# Patient Record
Sex: Male | Born: 1985 | Race: White | Hispanic: No | Marital: Single | Smoking: Former smoker
Health system: Southern US, Community
[De-identification: ages and names within clinical notes are randomized; demographics above are authoritative.]

---

## 2016-11-09 ENCOUNTER — Ambulatory Visit (INDEPENDENT_AMBULATORY_CARE_PROVIDER_SITE_OTHER): Payer: Managed Care, Other (non HMO) | Admitting: Family Medicine

## 2016-11-09 DIAGNOSIS — J209 Acute bronchitis, unspecified: Secondary | ICD-10-CM

## 2016-11-11 NOTE — Progress Notes (Signed)
Subjective:    Patient ID: Hunter Lee, male    DOB: 1985/04/22, 31 y.o.   MRN: [id number] No chief complaint on file.   HPI   No past medical history on file. No past surgical history on file. No current outpatient prescriptions on file prior to visit.   No current facility-administered medications on file prior to visit.    Allergies not on file No family history on file. Social History   Social History  . Marital status: Single    Spouse name: N/A  . Number of children: N/A  . Years of education: N/A   Social History Main Topics  . Smoking status: Not on file  . Smokeless tobacco: Not on file  . Alcohol use Not on file  . Drug use: Unknown  . Sexual activity: Not on file   Other Topics Concern  . Not on file   Social History Narrative  . No narrative on file      Review of Systems  Constitutional: Positive for activity change, appetite change, chills, diaphoresis and fatigue. Negative for fever.  HENT: Positive for congestion, ear pain, sore throat and voice change. Negative for ear discharge, postnasal drip, rhinorrhea, sinus pressure, sneezing and trouble swallowing.   Respiratory: Positive for cough. Negative for shortness of breath and wheezing.   Cardiovascular: Negative for chest pain.  Gastrointestinal: Negative for abdominal pain, constipation, diarrhea, nausea and vomiting.  Genitourinary: Negative for difficulty urinating and dysuria.  Musculoskeletal: Positive for arthralgias. Negative for myalgias, neck pain and neck stiffness.  Allergic/Immunologic: Negative for immunocompromised state.  Hematological: Positive for adenopathy.  Psychiatric/Behavioral: Positive for sleep disturbance.   See hpi    Objective:   Physical Exam  Constitutional: He is oriented to person, place, and time. He appears well-developed and well-nourished. He appears lethargic. He appears ill. No distress.  HENT:  Head: Normocephalic and atraumatic.  Right Ear:  External ear and ear canal normal. Tympanic membrane is erythematous.  Left Ear: External ear and ear canal normal. A middle ear effusion is present.  Nose: Nose normal.  Mouth/Throat: Mucous membranes are normal. Posterior oropharyngeal erythema present. No oropharyngeal exudate.  Eyes: Conjunctivae are normal. No scleral icterus.  Neck: Normal range of motion. Neck supple. No thyromegaly present.  Cardiovascular: Normal rate, regular rhythm, normal heart sounds and intact distal pulses.   Pulmonary/Chest: Effort normal and breath sounds normal. No respiratory distress.  Coarsened breath sounds throughout  Abdominal: Soft. Bowel sounds are normal. He exhibits no distension and no mass. There is no tenderness. There is no rebound and no guarding.  Musculoskeletal: He exhibits no edema.  Lymphadenopathy:       Head (right side): Submandibular adenopathy present.       Head (left side): Tonsillar adenopathy present.    He has cervical adenopathy.       Right cervical: Superficial cervical adenopathy present.       Left cervical: Superficial cervical adenopathy present.       Right: No supraclavicular adenopathy present.       Left: No supraclavicular adenopathy present.  Neurological: He is oriented to person, place, and time. He appears lethargic.  Skin: Skin is warm and dry. He is not diaphoretic. No erythema.  Psychiatric: He has a normal mood and affect. His behavior is normal.      There were no vitals taken for this visit.     Assessment & Plan:   1. Acute bronchitis, unspecified organism      Carley Hammed  Clelia Croft, M.D.  Primary Care at [facility] [address] [place], [place] [address] [phone] phone [phone] fax  11/11/16 5:35 AM

## 2017-01-10 ENCOUNTER — Ambulatory Visit: Payer: Managed Care, Other (non HMO) | Admitting: Physician Assistant

## 2017-01-10 VITALS — BP 142/70 | HR 77 | Temp 100.0°F | Ht 79.0 in | Wt 291.0 lb

## 2017-01-10 DIAGNOSIS — J029 Acute pharyngitis, unspecified: Secondary | ICD-10-CM | POA: Diagnosis not present

## 2017-01-10 LAB — POCT RAPID STREP A (OFFICE): Rapid Strep A Screen: NEGATIVE

## 2017-01-10 MED ORDER — AMOXICILLIN 500 MG PO CAPS: 500.0000 mg | ORAL_CAPSULE | Freq: Two times a day (BID) | ORAL | 0 refills | Status: DC

## 2017-01-10 NOTE — Patient Instructions (Addendum)
I will contact you regarding the result of your strep culture via mychart Make sure you are taking ibuprofen or tylenol for pain and fever.   Pharyngitis Pharyngitis is a sore throat (pharynx). There is redness, pain, and swelling of your throat. Follow these instructions at home:  Drink enough fluids to keep your pee (urine) clear or pale yellow.  Only take medicine as told by your doctor. ? You may get sick again if you do not take medicine as told. Finish your medicines, even if you start to feel better. ? Do not take aspirin.  Rest.  Rinse your mouth (gargle) with salt water ( tsp of salt per 1 qt of water) every 1-2 hours. This will help the pain.  If you are not at risk for choking, you can suck on hard candy or sore throat lozenges. Contact a doctor if:  You have large, tender lumps on your neck.  You have a rash.  You cough up green, yellow-brown, or bloody spit. Get help right away if:  You have a stiff neck.  You drool or cannot swallow liquids.  You throw up (vomit) or are not able to keep medicine or liquids down.  You have very bad pain that does not go away with medicine.  You have problems breathing (not from a stuffy nose). This information is not intended to replace advice given to you by your health care provider. Make sure you discuss any questions you have with your health care provider. Document Released: 07/05/2007 Document Revised: 06/24/2015 Document Reviewed: 09/23/2012 Elsevier Interactive Patient Education  2017 [employer] Inc[employer]  Fever, Adult A fever is an increase in the body's temperature. It is often defined as a temperature of 100 F (38C) or higher. Short mild or moderate fevers often have no long-term effects. They also often do not need treatment. Moderate or high fevers may make you feel uncomfortable. Sometimes, they can also be a sign of a serious illness or disease. The sweating that may happen with repeated fevers or fevers that last a  while may also cause you to not have enough fluid in your body (dehydration). You can take your temperature with a thermometer to see if you have a fever. A measured temperature can change with:  Age.  Time of day.  Where the thermometer is placed: ? Mouth (oral). ? Rectum (rectal). ? Ear (tympanic). ? Underarm (axillary). ? Forehead (temporal).  Follow these instructions at home: Pay attention to any changes in your symptoms. Take these actions to help with your condition:  Take over-the-counter and prescription medicines only as told by your doctor. Follow the dosing instructions carefully.  If you were prescribed an antibiotic medicine, take it as told by your doctor. Do not stop taking the antibiotic even if you start to feel better.  Rest as needed.  Drink enough fluid to keep your pee (urine) clear or pale yellow.  Sponge yourself or bathe with room-temperature water as needed. This helps to lower your body temperature . Do not use ice water.  Do not wear too many blankets or heavy clothes.  Contact a doctor if:  You throw up (vomit).  You cannot eat or drink without throwing up.  You have watery poop (diarrhea).  It hurts when you pee.  Your symptoms do not get better with treatment.  You have new symptoms.  You feel very weak. Get help right away if:  You are short of breath or have trouble breathing.  You are dizzy  or you pass out (faint).  You feel confused.  You have signs of not having enough fluid in your body, such as: ? A dry mouth. ? Peeing less. ? Looking pale.  You have very bad pain in your belly (abdomen).  You keep throwing up or having water poop.  You have a skin rash.  Your symptoms suddenly get worse. This information is not intended to replace advice given to you by your health care provider. Make sure you discuss any questions you have with your health care provider. Document Released: 10/26/2007 Document Revised: 06/24/2015  Document Reviewed: 03/12/2014 Elsevier Interactive Patient Education  2018 Elsevier Inc. Sore Throat When you have a sore throat, your throat may:  Hurt.  Burn.  Feel irritated.  Feel scratchy.  Many things can cause a sore throat, including:  An infection.  Allergies.  Dryness in the air.  Smoke or pollution.  Gastroesophageal reflux disease (GERD).  A tumor.  A sore throat can be the first sign of another sickness. It can happen with other problems, like coughing or a fever. Most sore throats go away without treatment. Follow these instructions at home:  Take over-the-counter medicines only as told by your doctor.  Drink enough fluids to keep your pee (urine) clear or pale yellow.  Rest when you feel you need to.  To help with pain, try: ? Sipping warm liquids, such as broth, herbal tea, or warm water. ? Eating or drinking cold or frozen liquids, such as frozen ice pops. ? Gargling with a salt-water mixture 3-4 times a day or as needed. To make a salt-water mixture, add -1 tsp of salt in 1 cup of warm water. Mix it until you cannot see the salt anymore. ? Sucking on hard candy or throat lozenges. ? Putting a cool-mist humidifier in your bedroom at night. ? Sitting in the bathroom with the door closed for 5-10 minutes while you run hot water in the shower.  Do not use any tobacco products, such as cigarettes, chewing tobacco, and e-cigarettes. If you need help quitting, ask your doctor. Contact a doctor if:  You have a fever for more than 2-3 days.  You keep having symptoms for more than 2-3 days.  Your throat does not get better in 7 days.  You have a fever and your symptoms suddenly get worse. Get help right away if:  You have trouble breathing.  You cannot swallow fluids, soft foods, or your saliva.  You have swelling in your throat or neck that gets worse.  You keep feeling like you are going to throw up (vomit).  You keep throwing up. This  information is not intended to replace advice given to you by your health care provider. Make sure you discuss any questions you have with your health care provider. Document Released: 10/26/2007 Document Revised: 09/12/2015 Document Reviewed: 11/06/2014 Elsevier Interactive Patient Education  2018 [employer]     IF you received an x-ray today, you will receive an invoice from [facility]. Please contact [facility] at [phone] with questions or concerns regarding your invoice.   IF you received labwork today, you will receive an invoice from [facility]. Please contact [facility] at [phone] with questions or concerns regarding your invoice.   Our billing staff will not be able to assist you with questions regarding bills from these companies.  You will be contacted with the lab results as soon as they are available. The fastest way to get your results is to activate  your My Chart account. Instructions are located on the last page of this paperwork. If you have not heard from [place] regarding the results in 2 weeks, please contact this office.

## 2017-01-10 NOTE — Progress Notes (Signed)
[facility] [address], [place] [phone]  Date:  01/10/2017   Name:  Hunter Lee   DOB:  1985-12-24   MRN:  [id number]  PCP:  Patient, No Pcp Per    History of Present Illness:  Hunter Lee is a 31 y.o. male patient who presents to PCP with  Chief Complaint  Patient presents with  . Sore Throat    flu-like symptoms  . Fever     Throat pain, and white pus at the throat.  He has had feverish, body aches.  No nasal congestiton or cough.  He has not done much, but magic mouthwash.     There are no active problems to display for this patient.   No past medical history on file.    Social History   Tobacco Use  . Smoking status: Not on file  Substance Use Topics  . Alcohol use: Not on file  . Drug use: Not on file    No family history on file.  Allergies not on file  Medication list has been reviewed and updated.  No current outpatient medications on file prior to visit.   No current facility-administered medications on file prior to visit.     ROS ROS otherwise unremarkable unless listed above.  Physical Examination: BP (!) 142/70 (BP Location: Left Arm, Patient Position: Sitting, Cuff Size: Large)   Pulse 77   Temp 100 F (37.8 C) (Oral)   Ht 6\' 7"  (2.007 m)   Wt 291 lb (132 kg)   SpO2 99%   BMI 32.78 kg/m  Ideal Body Weight: Weight in (lb) to have BMI = 25: 221.5  Physical Exam  Constitutional: He is oriented to person, place, and time. He appears well-developed and well-nourished. No distress.  HENT:  Head: Normocephalic and atraumatic.  Right Ear: Tympanic membrane, external ear and ear canal normal.  Left Ear: Tympanic membrane, external ear and ear canal normal.  Nose: No mucosal edema or rhinorrhea. Right sinus exhibits no maxillary sinus tenderness and no frontal sinus tenderness. Left sinus exhibits no maxillary sinus tenderness and no frontal sinus tenderness.  Mouth/Throat: No uvula swelling.  Posterior oropharyngeal edema and posterior oropharyngeal erythema present. Tonsils are 2+ on the right. Tonsils are 2+ on the left. Tonsillar exudate.  Eyes: Conjunctivae, EOM and lids are normal. Pupils are equal, round, and reactive to light. Right eye exhibits normal extraocular motion. Left eye exhibits normal extraocular motion.  Neck: Trachea normal and full passive range of motion without pain. No edema and no erythema present.  Cardiovascular: Normal rate.  Pulmonary/Chest: Effort normal. No respiratory distress. He has no decreased breath sounds. He has no wheezes. He has no rhonchi.  Neurological: He is alert and oriented to person, place, and time.  Skin: Skin is warm and dry. He is not diaphoretic.  Psychiatric: He has a normal mood and affect. His behavior is normal.     Assessment and Plan: Hunter Lee is a 31 y.o. male who is here today for cc of  Chief Complaint  Patient presents with  . Sore Throat    flu-like symptoms  . Fever  will culture, and if this is negative, will advise to discontinue the antibiotic.  Acute pharyngitis, unspecified etiology - Plan: amoxicillin (AMOXIL) 500 MG capsule, DISCONTINUED: amoxicillin (AMOXIL) 500 MG capsule  Sore throat - Plan: POCT rapid strep A, Culture, Group A Strep  Trena PlattStephanie Ciji [place], PA-C [facility] 12/15/20183:34  PM

## 2017-01-12 LAB — CULTURE, GROUP A STREP

## 2017-05-09 ENCOUNTER — Encounter: Payer: Self-pay | Admitting: Physician Assistant

## 2017-05-15 ENCOUNTER — Ambulatory Visit: Payer: Managed Care, Other (non HMO) | Admitting: Physician Assistant

## 2017-05-15 ENCOUNTER — Telehealth: Payer: Self-pay | Admitting: Physician Assistant

## 2017-05-15 ENCOUNTER — Other Ambulatory Visit: Payer: Self-pay

## 2017-05-15 ENCOUNTER — Encounter: Payer: Self-pay | Admitting: Physician Assistant

## 2017-05-15 VITALS — BP 112/77 | HR 94 | Temp 98.2°F | Resp 16 | Ht 78.25 in | Wt 297.2 lb

## 2017-05-15 DIAGNOSIS — M545 Low back pain, unspecified: Secondary | ICD-10-CM

## 2017-05-15 MED ORDER — CYCLOBENZAPRINE HCL ER 30 MG PO CP24: 30.0000 mg | ORAL_CAPSULE | Freq: Every day | ORAL | 0 refills | Status: DC | PRN

## 2017-05-15 MED ORDER — TRAMADOL HCL 50 MG PO TABS: 50.0000 mg | ORAL_TABLET | Freq: Three times a day (TID) | ORAL | 0 refills | Status: DC | PRN

## 2017-05-15 MED ORDER — PREDNISONE 20 MG PO TABS: ORAL_TABLET | ORAL | 0 refills | Status: DC

## 2017-05-15 MED ORDER — NAPROXEN 500 MG PO TABS: 500.0000 mg | ORAL_TABLET | Freq: Two times a day (BID) | ORAL | 1 refills | Status: DC

## 2017-05-15 NOTE — Patient Instructions (Addendum)
You will receive a phone call to schedule an appointment with physical therapy.  Start taking Prednisone as directed.  Buy a foam roller use this 2x/day. Continue using heat as needed for back pain. Stay well hydrated (try to drink 2-3 liters of water daily.) Read below for trigger point injection info.  Continue stretching daily.   Naproxen is an NSAID. Do not use with any other otc pain medication other than tylenol/acetaminophen - so no aleve, ibuprofen, motrin, advil, etc. Take this daily for the next 2-3 weeks.   Amrix is a muscle relaxer. You may take this with Naproxen and Prednisone - do not take at the same time as tramadol.  Take this daily for the next week, then take it as needed.  If your pain is intolerable, you can take Tramadol at night for pain to help you sleep. Do not take this at the same time as Cyclobenzaprine (Amrix). Do not drink excessively while taking Tramadol.   Come back and see me in 3-4 weeks if you are not improving. We will do imaging at that time.   Tramadol tablets What is this medicine? TRAMADOL (TRA ma dole) is a pain reliever. It is used to treat moderate to severe pain in adults. This medicine may be used for other purposes; ask your health care provider or pharmacist if you have questions. COMMON BRAND NAME(S): Ultram How should I use this medicine? Take this medicine by mouth with a full glass of water. Follow the directions on the prescription label. You can take it with or without food. If it upsets your stomach, take it with food. Do not take your medicine more often than directed. A special MedGuide will be given to you by the pharmacist with each prescription and refill. Be sure to read this information carefully each time. Talk to your pediatrician regarding the use of this medicine in children. Special care may be needed. Overdosage: If you think you have taken too much of this medicine contact a poison control center or emergency room at  once. NOTE: This medicine is only for you. Do not share this medicine with others. What if I miss a dose? If you miss a dose, take it as soon as you can. If it is almost time for your next dose, take only that dose. Do not take double or extra doses. What may interact with this medicine? Do not take this medication with any of the following medicines: -MAOIs like Carbex, Eldepryl, Marplan, Nardil, and Parnate This medicine may also interact with the following medications: -alcohol -antihistamines for allergy, cough and cold -certain medicines for anxiety or sleep -certain medicines for depression like amitriptyline, fluoxetine, sertraline -certain medicines for migraine headache like almotriptan, eletriptan, frovatriptan, naratriptan, rizatriptan, sumatriptan, zolmitriptan -certain medicines for seizures like carbamazepine, oxcarbazepine, phenobarbital, primidone -general anesthetics like halothane, isoflurane, methoxyflurane, propofol -linezolid -local anesthetics like lidocaine, pramoxine, tetracaine -medicines that relax muscles for surgery -other narcotic medicines for pain or cough -phenothiazines like chlorpromazine, mesoridazine, prochlorperazine, thioridazine -procarbazine This list may not describe all possible interactions. Give your health care provider a list of all the medicines, herbs, non-prescription drugs, or dietary supplements you use. Also tell them if you smoke, drink alcohol, or use illegal drugs. Some items may interact with your medicine. What should I watch for while using this medicine? Tell your doctor or health care professional if your pain does not go away, if it gets worse, or if you have new or a different type of pain. You  may develop tolerance to the medicine. Tolerance means that you will need a higher dose of the medicine for pain relief. Tolerance is normal and is expected if you take this medicine for a long time. Do not suddenly stop taking your medicine  because you may develop a severe reaction. Your body becomes used to the medicine. This does NOT mean you are addicted. Addiction is a behavior related to getting and using a drug for a non-medical reason. If you have pain, you have a medical reason to take pain medicine. Your doctor will tell you how much medicine to take. If your doctor wants you to stop the medicine, the dose will be slowly lowered over time to avoid any side effects. There are different types of narcotic medicines (opiates). If you take more than one type at the same time or if you are taking another medicine that also causes drowsiness, you may have more side effects. Give your health care provider a list of all medicines you use. Your doctor will tell you how much medicine to take. Do not take more medicine than directed. Call emergency for help if you have problems breathing or unusual sleepiness. You may get drowsy or dizzy. Do not drive, use machinery, or do anything that needs mental alertness until you know how this medicine affects you. Do not stand or sit up quickly, especially if you are an older patient. This reduces the risk of dizzy or fainting spells. Alcohol can increase or decrease the effects of this medicine. Avoid alcoholic drinks. You may have constipation. Try to have a bowel movement at least every 2 to 3 days. If you do not have a bowel movement for 3 days, call your doctor or health care professional. Your mouth may get dry. Chewing sugarless gum or sucking hard candy, and drinking plenty of water may help. Contact your doctor if the problem does not go away or is severe. What side effects may I notice from receiving this medicine? Side effects that you should report to your doctor or health care professional as soon as possible: -allergic reactions like skin rash, itching or hives, swelling of the face, lips, or tongue -breathing problems -confusion -seizures -signs and symptoms of low blood pressure like  dizziness; feeling faint or lightheaded, falls; unusually weak or tired -trouble passing urine or change in the amount of urine Side effects that usually do not require medical attention (report to your doctor or health care professional if they continue or are bothersome): -constipation -dry mouth -nausea, vomiting -tiredness This list may not describe all possible side effects. Call your doctor for medical advice about side effects. You may report side effects to FDA at 1-800-FDA-1088. Where should I keep my medicine? Keep out of the reach of children. This medicine may cause accidental overdose and death if it taken by other adults, children, or pets. Mix any unused medicine with a substance like cat litter or coffee grounds. Then throw the medicine away in a sealed container like a sealed bag or a coffee can with a lid. Do not use the medicine after the expiration date. Store at room temperature between 15 and 30 degrees C (59 and 86 degrees F). NOTE: This sheet is a summary. It may not cover all possible information. If you have questions about this medicine, talk to your doctor, pharmacist, or health care provider.  2018 Elsevier/Gold Standard (2014-10-11 09:00:04)  Trigger Point Injection Trigger points are areas where you have pain. A trigger point injection  is a shot given in the trigger point to help relieve pain for a few days to a few months. Common places for trigger points include:  The neck.  The shoulders.  The upper back.  The lower back.  A trigger point injection will not cure long-lasting (chronic) pain permanently. These injections do not always work for every person, but for some people they can help to relieve pain for a few days to a few months. Tell a health care provider about:  Any allergies you have.  All medicines you are taking, including vitamins, herbs, eye drops, creams, and over-the-counter medicines.  Any problems you or family members have had  with anesthetic medicines.  Any blood disorders you have.  Any surgeries you have had.  Any medical conditions you have. What are the risks? Generally, this is a safe procedure. However, problems may occur, including:  Infection.  Bleeding.  Allergic reaction to the injected medicine.  Irritation of the skin around the injection site.  What happens before the procedure?  Ask your health care provider about changing or stopping your regular medicines. This is especially important if you are taking diabetes medicines or blood thinners. What happens during the procedure?  Your health care provider will feel for trigger points. A marker may be used to circle the area for the injection.  The skin over the trigger point will be washed with a germ-killing (antiseptic) solution.  A thin needle is used for the shot. You may feel pain or a twitching feeling when the needle enters the trigger point.  A numbing solution may be injected into the trigger point. Sometimes a medicine to keep down swelling, redness, and warmth (inflammation) is also injected.  Your health care provider may move the needle around the area where the trigger point is located until the tightness and twitching goes away.  After the injection, your health care provider may put gentle pressure over the injection site.  The injection site will be covered with a bandage (dressing). The procedure may vary among health care providers and hospitals. What happens after the procedure?  The dressing can be taken off in a few hours or as told by your health care provider.  You may feel sore and stiff for 1-2 days. This information is not intended to replace advice given to you by your health care provider. Make sure you discuss any questions you have with your health care provider. Document Released: 01/05/2011 Document Revised: 09/19/2015 Document Reviewed: 07/06/2014 Elsevier Interactive Patient Education  [employer].  Thank you for coming in today. I hope you feel we met your needs.  Feel free to call PCP if you have any questions or further requests.  Please consider signing up for MyChart if you do not already have it, as this is a great way to communicate with me.  Best,  Whitney McVey, PA-C  IF you received an x-ray today, you will receive an invoice from [facility]. Please contact [facility] at [phone] with questions or concerns regarding your invoice.   IF you received labwork today, you will receive an invoice from [facility]. Please contact [facility] at [phone] with questions or concerns regarding your invoice.   Our billing staff will not be able to assist you with questions regarding bills from these companies.  You will be contacted with the lab results as soon as they are available. The fastest way to get your results is to activate your My Chart account. Instructions are  located on the last page of this paperwork. If you have not heard from [place] regarding the results in 2 weeks, please contact this office.

## 2017-05-15 NOTE — Telephone Encounter (Signed)
Copied from CRM [phone]. Topic: Quick Communication - See Telephone Encounter >> May 15, 2017  5:10 PM Lorrine KinMcGee, Blake Vetrano B, [place] wrote: CRM for notification. See Telephone encounter for: 05/15/17. Caleb at [facility] calling to see if an alternative for cyclobenzaprine (AMRIX) 30 MG 24 hr capsule can be prescribed because the patient is going to have to pay $528 out of pocket for this medication. Wilber OliphantCaleb is requesting a call back [phone]. Patient [place] tomorrow.

## 2017-05-15 NOTE — Progress Notes (Signed)
   Hunter BouillonStephen Lee  MRN: [id number] DOB: Sep 04, 1985  PCP: Patient, No Pcp Per  Subjective:  Pt is a 32 year old male who presents to clinic for back pain x 3 weeks. Pain is of the left side of his lower back. Radiates up his back and occasionally down right leg. When it's bad, he cannot stand up straight. Pain is occasionally "sharp".  No MOI.  Massaging with a baseball against a wall makes it better. He is stretching, which is helping.  Baclofen does not help. Ibuprofen and excedrin are not helping.   He has had off and on back problems x 15 years.   He has been to chiropractor for this problem - last OV was >1 year ago.   Has never been to PT for this. Has never had dry needling.   ROS below.   Review of Systems  Gastrointestinal: Negative for constipation and diarrhea.  Genitourinary: Negative for decreased urine volume, dysuria, enuresis, frequency, hematuria and urgency.  Musculoskeletal: Positive for back pain and gait problem.  Neurological: Negative for weakness and numbness.    There are no active problems to display for this patient.   No current outpatient medications on file prior to visit.   No current facility-administered medications on file prior to visit.     No Known Allergies   Objective:  BP 112/77 (BP Location: Right Arm)   Pulse 94   Temp 98.2 F (36.8 C) (Oral)   Resp 16   Ht 6' 6.25" (1.988 m)   Wt 297 lb 3.2 oz (134.8 kg)   SpO2 98%   BMI 34.13 kg/m   Physical Exam  Constitutional: He is oriented to person, place, and time. He appears well-developed and well-nourished.  Pulmonary/Chest: Effort normal. No respiratory distress.  Musculoskeletal:       Right hip: He exhibits normal range of motion, normal strength, no tenderness and no bony tenderness.       Lumbar back: He exhibits decreased range of motion and tenderness. He exhibits no bony tenderness and no deformity.       Back:  Neurological: He is alert and oriented to person, place,  and time. Gait normal.  Skin: Skin is warm and dry.  Psychiatric: He has a normal mood and affect. His behavior is normal. Judgment and thought content normal.  Vitals reviewed.   Assessment and Plan :  1. Left-sided low back pain without sciatica, unspecified chronicity - predniSONE (DELTASONE) 20 MG tablet; Take 3 PO QAM x3days, 2 PO QAM x3days, 1 PO QAM x3days  Dispense: 18 tablet; Refill: 0 - traMADol (ULTRAM) 50 MG tablet; Take 1-2 tablets (50-100 mg total) by mouth every 8 (eight) hours as needed for moderate pain.  Dispense: 30 tablet; Refill: 0 - naproxen (NAPROSYN) 500 MG tablet; Take 1 tablet (500 mg total) by mouth 2 (two) times daily with a meal.  Dispense: 30 tablet; Refill: 1 - cyclobenzaprine (AMRIX) 30 MG 24 hr capsule; Take 1 capsule (30 mg total) by mouth daily as needed for muscle spasms.  Dispense: 30 capsule; Refill: 0 - Ambulatory referral to Physical Therapy - Pt presents for acute on chronic back pain. No red flags on PE. Suspect MSK. Plan to refer to PT for trigger point therapy. Advised hydration, heat and massage. Medication side effect profile discussed. He verbalized understanding. RTC if no improvement, consider imaging.   Marco CollieWhitney Greyden Besecker, PA-C  Primary [facility] at [facility] 05/15/2017 2:59 PM

## 2017-05-16 NOTE — Telephone Encounter (Signed)
[facility] checking status. Please advise

## 2017-05-16 NOTE — Telephone Encounter (Signed)
[facility] to [facility] [address], spoke with Marchelle FolksAmanda. She states test claim was run over at their pharmacy but [facility] [address]- patient had wanted to check to confirm price.   Phone call to patient. Per signed authorization to leave detailed message, left voicemail stating message received regarding cyclobenzaprine price. Does he want medication sent to another pharmacy in [place] or was he able to pick his prescription up?

## 2017-05-23 ENCOUNTER — Other Ambulatory Visit: Payer: Self-pay

## 2017-05-23 DIAGNOSIS — M545 Low back pain, unspecified: Secondary | ICD-10-CM

## 2017-05-23 MED ORDER — CYCLOBENZAPRINE HCL 10 MG PO TABS: 10.0000 mg | ORAL_TABLET | Freq: Three times a day (TID) | ORAL | 0 refills | Status: DC | PRN

## 2017-05-23 NOTE — Telephone Encounter (Signed)
Spoke with [facility] - Pt picked up Tramadol, Prednisone and Naproxen.  Did not pick up Cyclobenzaprine.  Tried to call pt to inquire if he needed refills [facility]  Mail box full.

## 2017-05-23 NOTE — Telephone Encounter (Signed)
Patient called back and states he is back in town and would like the medication sent to [facility] - [place], [place] - [address]. Please advise.  CB#: [phone].

## 2017-05-23 NOTE — Telephone Encounter (Signed)
Pt wants Cyclobenzaprine (generic) called  [facility]acy was to call and get something cheaper.   Discussed with [place] - ordered Flexeril 10mg . - Called to [facility].

## 2017-11-02 ENCOUNTER — Other Ambulatory Visit: Payer: Self-pay | Admitting: Physician Assistant

## 2017-11-02 DIAGNOSIS — M545 Low back pain, unspecified: Secondary | ICD-10-CM

## 2018-10-11 ENCOUNTER — Other Ambulatory Visit: Payer: Self-pay

## 2018-10-11 ENCOUNTER — Ambulatory Visit (INDEPENDENT_AMBULATORY_CARE_PROVIDER_SITE_OTHER): Payer: Managed Care, Other (non HMO)

## 2018-10-11 ENCOUNTER — Encounter: Payer: Self-pay | Admitting: Family Medicine

## 2018-10-11 ENCOUNTER — Ambulatory Visit: Payer: Managed Care, Other (non HMO) | Admitting: Family Medicine

## 2018-10-11 VITALS — BP 153/87 | HR 78 | Temp 99.3°F | Ht 79.0 in | Wt 308.4 lb

## 2018-10-11 DIAGNOSIS — M5441 Lumbago with sciatica, right side: Secondary | ICD-10-CM

## 2018-10-11 DIAGNOSIS — M62830 Muscle spasm of back: Secondary | ICD-10-CM | POA: Diagnosis not present

## 2018-10-11 DIAGNOSIS — G8929 Other chronic pain: Secondary | ICD-10-CM

## 2018-10-11 MED ORDER — PREDNISONE 20 MG PO TABS: ORAL_TABLET | ORAL | 0 refills | Status: DC

## 2018-10-11 MED ORDER — METHOCARBAMOL 500 MG PO TABS: ORAL_TABLET | ORAL | 1 refills | Status: DC

## 2018-10-11 NOTE — Progress Notes (Signed)
Patient ID: Hunter Lee, male    DOB: 08-24-1985  Age: 33 y.o. MRN: [id number]  Chief Complaint  Patient presents with  . Back Pain    going on for years. last couple weeks has been worse (lower back to hip)    Subjective:   33 year old male who comes in today complaining of low back pain.  Apparently Hunter Lee has had problems with pain in his low back for about 10 years.  When Hunter Lee first started having pains Hunter Lee had slipped and fallen on the floor.  Pains of coming gone.  Hunter Lee has in the past been to a doctor and a Restaurant manager, fast food.  Hunter Lee was here over a year ago with low back pain and treated with steroids which helped though Hunter Lee did not like the way the prednisone made him feel.  For the physical therapy but improved after that Hunter Lee did not follow through with that.  Hunter Lee works at [employer] doing everything, but has not had any specific job-related injury.  Hunter Lee is not playing any sports.  His weight is stayed about the same over the last year.  Hunter Lee does get the pain primarily in the right low back laterally and some numbness and discomfort down his leg to the foot.  Hunter Lee can get to a comfortable position in bed, but then after a few hours of sleep Hunter Lee gets in a position where it starts hurting again.  Hunter Lee is originally from [place], and last had his x-rays by his chiropractor there.  Hunter Lee has not been specifically labeled with scoliosis.  Hunter Lee is quite tall.  Current allergies, medications, problem list, past/family and social histories reviewed.  Objective:  BP (!) 153/87 (BP Location: Right Arm, Patient Position: Sitting, Cuff Size: Normal)   Pulse 78   Temp 99.3 F (37.4 C) (Oral)   Ht 6\' 7"  (2.007 m)   Wt (!) 308 lb 6.4 oz (139.9 kg)   SpO2 97%   BMI 34.74 kg/m   Tall young man, moderately overweight, no major distress.  Alert and oriented.  On inspection his back appears to have a little curvature to the left.  His pain is primarily on the right.  Straight leg raising test caused pain and spasm, but Hunter Lee  was able to go up to 80 degrees bilaterally.  Deep tendon reflex symmetrical.  Sensory grossly normal.  Assessment & Plan:   Assessment: 1. Chronic right-sided low back pain with right-sided sciatica   2. Acute right-sided low back pain with right-sided sciatica       Plan: We will get plain x-rays of the back and then proceed from there.  X-ray just shows some mild disc space narrowing.  Consider chiropractory.  Suggested Dr. Kenyon Ana.  See instructions.  Orders Placed This Encounter  Procedures  . DG Lumbar Spine Complete    Standing Status:   Future    Number of Occurrences:   1    Standing Expiration Date:   10/11/2019    Order Specific Question:   Reason for Exam (SYMPTOM  OR DIAGNOSIS REQUIRED)    Answer:   low back pain with appearance of scoliosis low back; right low back pain    Order Specific Question:   Preferred imaging location?    Answer:   External    No orders of the defined types were placed in this encounter.        Patient Instructions   Prednisone 20 mg 3 pills daily for 2 days, then 2 daily for  2 days, then 1 daily for 2 days then one half daily for 4 days for back.  Take Robaxin 500 mg 1 in the morning, 1 in the afternoon, and 2 at bedtime for muscle relaxant  Take over-the-counter naproxen (Aleve) 220 mg 2 pills twice daily at breakfast and supper for pain and inflammation  In addition to all the above you can take Tylenol 500 mg 2 pills 3 times daily (acetaminophen).  Do not exceed 3000 mg in 24 hours  Consider seeing a chiropractor as we discussed.  If you keep having problems we can send you to an orthopedic back specialist also.  As we discussed I recommend trying to lose weight and get regular exercise.  I know you get a lot of exercise at work also.  Return as needed    If you have lab work done today you will be contacted with your lab results within the next 2 weeks.  If you have not heard from us then please contact us. The  fastest way to get your results is to register for My Chart.   IF you received an x-ray today, you will receive an invoice from [facility]. Please contact [facility] at [phone] with questions or concerns regarding your invoice.   IF you received labwork today, you will receive an invoice from [facility]. Please contact [facility] at [phone] with questions or concerns regarding your invoice.   Our billing staff will not be able to assist you with questions regarding bills from these companies.  You will be contacted with the lab results as soon as they are available. The fastest way to get your results is to activate your My Chart account. Instructions are located on the last page of this paperwork. If you have not heard from us regarding the results in 2 weeks, please contact this office.         Return if symptoms worsen or fail to improve.   Janace Hoardavid Hopper, MD 10/11/2018

## 2018-10-11 NOTE — Patient Instructions (Addendum)
Prednisone 20 mg 3 pills daily for 2 days, then 2 daily for 2 days, then 1 daily for 2 days then one half daily for 4 days for back.  Take Robaxin 500 mg 1 in the morning, 1 in the afternoon, and 2 at bedtime for muscle relaxant  Take over-the-counter naproxen (Aleve) 220 mg 2 pills twice daily at breakfast and supper for pain and inflammation  In addition to all the above you can take Tylenol 500 mg 2 pills 3 times daily (acetaminophen).  Do not exceed 3000 mg in 24 hours  Consider seeing a chiropractor as we discussed.  If you keep having problems we can send you to an orthopedic back specialist also.  As we discussed I recommend trying to lose weight and get regular exercise.  I know you get a lot of exercise at work also.  Return as needed    If you have lab work done today you will be contacted with your lab results within the next 2 weeks.  If you have not heard from [place] then please contact us. The fastest way to get your results is to register for My Chart.   IF you received an x-ray today, you will receive an invoice from [facility]. Please contact [facility] at [phone] with questions or concerns regarding your invoice.   IF you received labwork today, you will receive an invoice from [facility]. Please contact [facility] at [phone] with questions or concerns regarding your invoice.   Our billing staff will not be able to assist you with questions regarding bills from these companies.  You will be contacted with the lab results as soon as they are available. The fastest way to get your results is to activate your My Chart account. Instructions are located on the last page of this paperwork. If you have not heard from [place] regarding the results in 2 weeks, please contact this office.

## 2018-10-19 ENCOUNTER — Inpatient Hospital Stay
Admission: RE | Admit: 2018-10-19 | Discharge: 2018-10-19 | Disposition: A | Discharge status: Home / Self Care | Payer: Self-pay | Source: Ambulatory Visit

## 2018-10-19 ENCOUNTER — Encounter: Payer: Self-pay | Admitting: Physician Assistant

## 2018-10-19 DIAGNOSIS — M545 Low back pain: Secondary | ICD-10-CM

## 2018-10-19 DIAGNOSIS — M62838 Other muscle spasm: Secondary | ICD-10-CM

## 2018-10-19 MED ORDER — TIZANIDINE HCL 4 MG PO CAPS: 4.0000 mg | ORAL_CAPSULE | Freq: Three times a day (TID) | ORAL | 0 refills | Status: AC | PRN

## 2018-10-19 MED ORDER — MELOXICAM 7.5 MG PO TABS: 7.5000 mg | ORAL_TABLET | Freq: Every day | ORAL | 0 refills | Status: AC

## 2018-10-19 NOTE — ED Provider Notes (Signed)
Virtual Visit via Video Note:  Hunter Lee  initiated request for Telemedicine visit with [facility] team. I connected with Hunter Lee  on 10/19/2018 at 3:51 PM  for a synchronized telemedicine visit using a video enabled HIPPA compliant telemedicine application. I verified that I am speaking with Hunter Lee  using two identifiers. Amy Jodell Cipro, PA-C  was physically located in a [facility] Urgent care site and Hunter Lee was located at a different location.   The limitations of evaluation and management by telemedicine as well as the availability of in-person appointments were discussed. Patient was informed that he  may incur a bill ( including co-pay) for this virtual visit encounter. Hunter Lee  expressed understanding and gave verbal consent to proceed with virtual visit.     History of Present Illness:Hunter Lee  is a 33 y.o. male presents with continued right sided back pain after being seen 10/11/2018.  Patient was prescribed prednisone and Robaxin for his symptoms at that time.  States prednisone helped relieve symptoms.  Few days ago, he started having right-sided muscle spasms, for which he took Robaxin for without any relief.  He denies any injury/trauma.  States he works at [employer], no recent heavy lifting.  He denies radiation of pain, numbness, tingling, saddle anesthesia, loss of bladder or bowel control.  He has tried heat that made symptoms worse, ice with mild relief.    History reviewed. No pertinent past medical history.  No Known Allergies      Observations/Objective: General: Well appearing, nontoxic, no acute distress. Sitting comfortably. Head: Normocephalic, atraumatic Eye: No conjunctival injection, eyelid swelling. EOMI ENT: Mucus membranes moist, no lip cracking. No obvious nasal drainage. Pulm: Speaking in full sentences without difficulty. Normal effort. No respiratory distress, accessory muscle use. MSK: unable to  visualize back given video visit through phone. Pain is to the right lumbar region. Able to move hips/back, though with pain. Unable to assess straight leg raise, patient sitting in break room at work.  Neuro: Normal mental status. Alert and oriented x 3.   Assessment and Plan: Will discontinue robaxin and switch to tizanidine. Given recent course of prednisone, will avoid for now and try mobic. Patient to follow up with sports medicine/orthopedics if still without relief. Return precautions given.  Follow Up Instructions:    I discussed the assessment and treatment plan with the patient. The patient was provided an opportunity to ask questions and all were answered. The patient agreed with the plan and demonstrated an understanding of the instructions.   The patient was advised to call back or seek an in-person evaluation if the symptoms worsen or if the condition fails to improve as anticipated.  I provided 15 minutes of non-face-to-face time during this encounter.    Ok Edwards, PA-C  10/19/2018 3:51 PM          Ok Edwards, PA-C 10/19/18 1551

## 2018-10-19 NOTE — Discharge Instructions (Addendum)
Start Mobic. Do not take ibuprofen (motrin/advil)/ naproxen (aleve) while on mobic. Discontinue robaxin and start tizanidine as needed, this can make you drowsy, so do not take if you are going to drive, operate heavy machinery, or make important decisions. Ice/heat compresses as needed. This can take up to 3-4 weeks to completely resolve, but you should be feeling better each week. Follow up with PCP if symptoms worsen, changes for reevaluation. If experience numbness/tingling of the inner thighs, loss of bladder or bowel control, go to the emergency department for evaluation.

## 2018-10-21 ENCOUNTER — Ambulatory Visit (INDEPENDENT_AMBULATORY_CARE_PROVIDER_SITE_OTHER)
Admission: EM | Admit: 2018-10-21 | Discharge: 2018-10-21 | Disposition: A | Discharge status: Home / Self Care | Payer: Managed Care, Other (non HMO) | Source: Home / Self Care

## 2018-10-21 DIAGNOSIS — M545 Low back pain, unspecified: Secondary | ICD-10-CM

## 2018-10-21 DIAGNOSIS — M62838 Other muscle spasm: Secondary | ICD-10-CM

## 2021-03-21 IMAGING — DX DG LUMBAR SPINE COMPLETE 4+V
5 series · 5 of 5 positions shown · non-contrast
Comparison: None.

CLINICAL DATA: 33-year-old male with low back pain.

EXAM:
LUMBAR SPINE - COMPLETE 4+ VIEW

[l-spine ap]
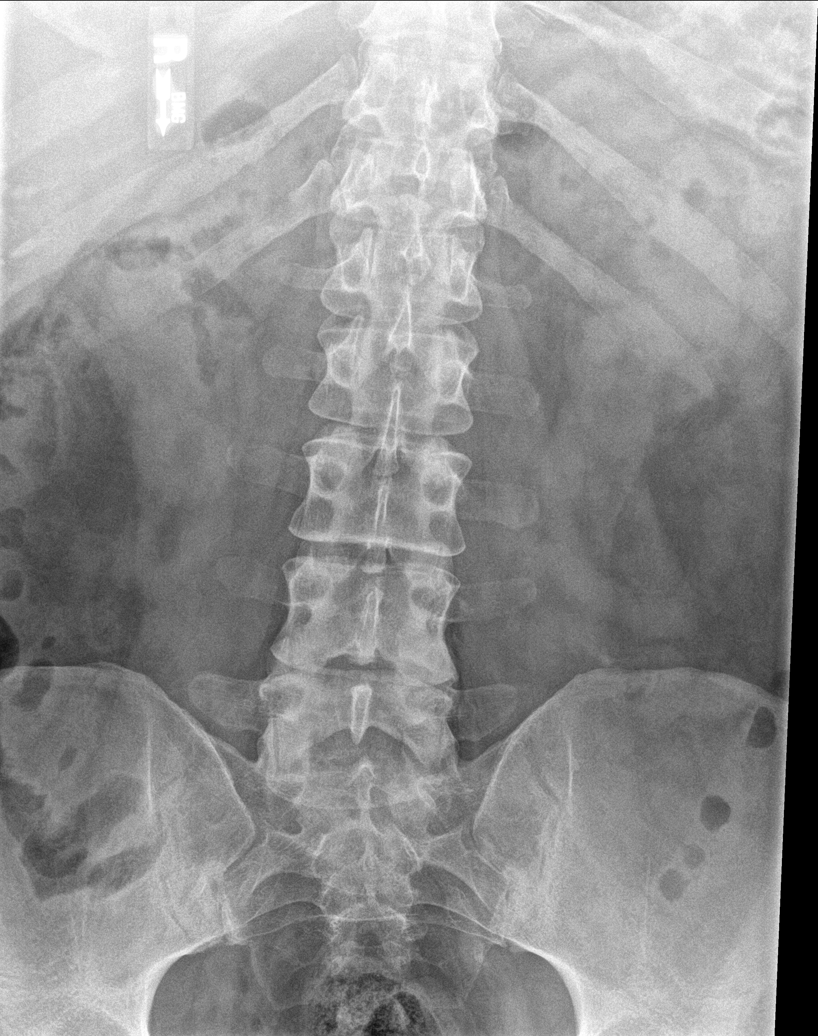

[l-spine obl (1 of 2)]
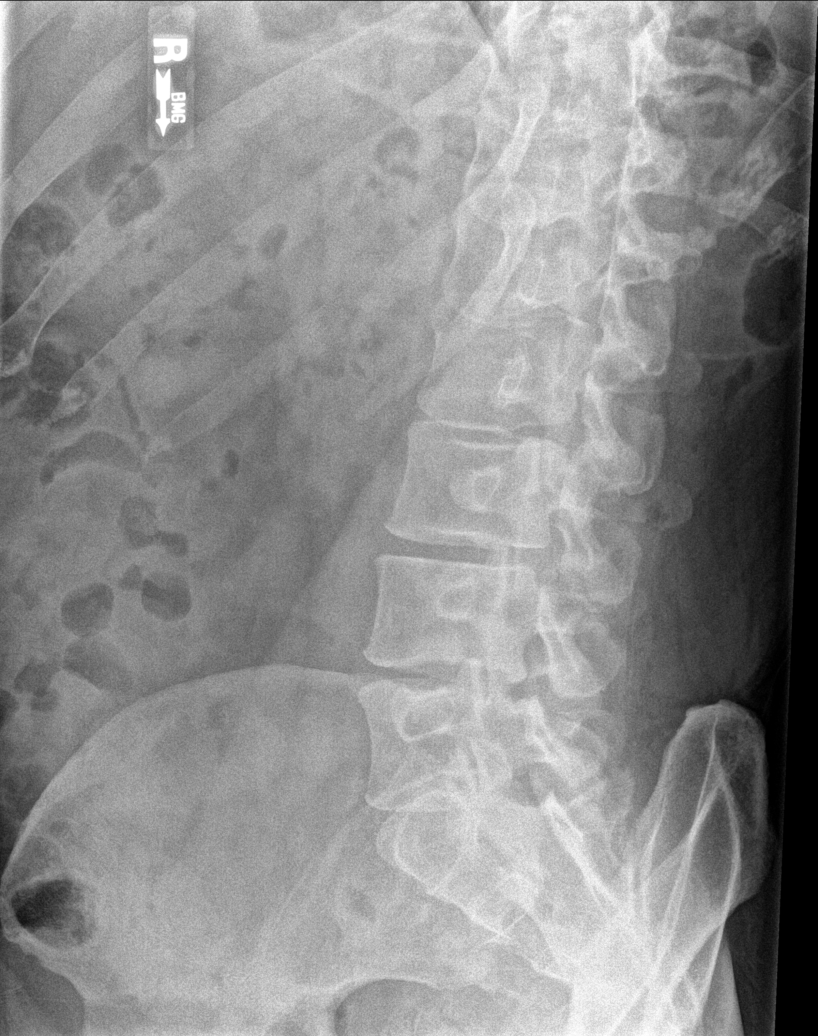

[l-spine obl (2 of 2)]
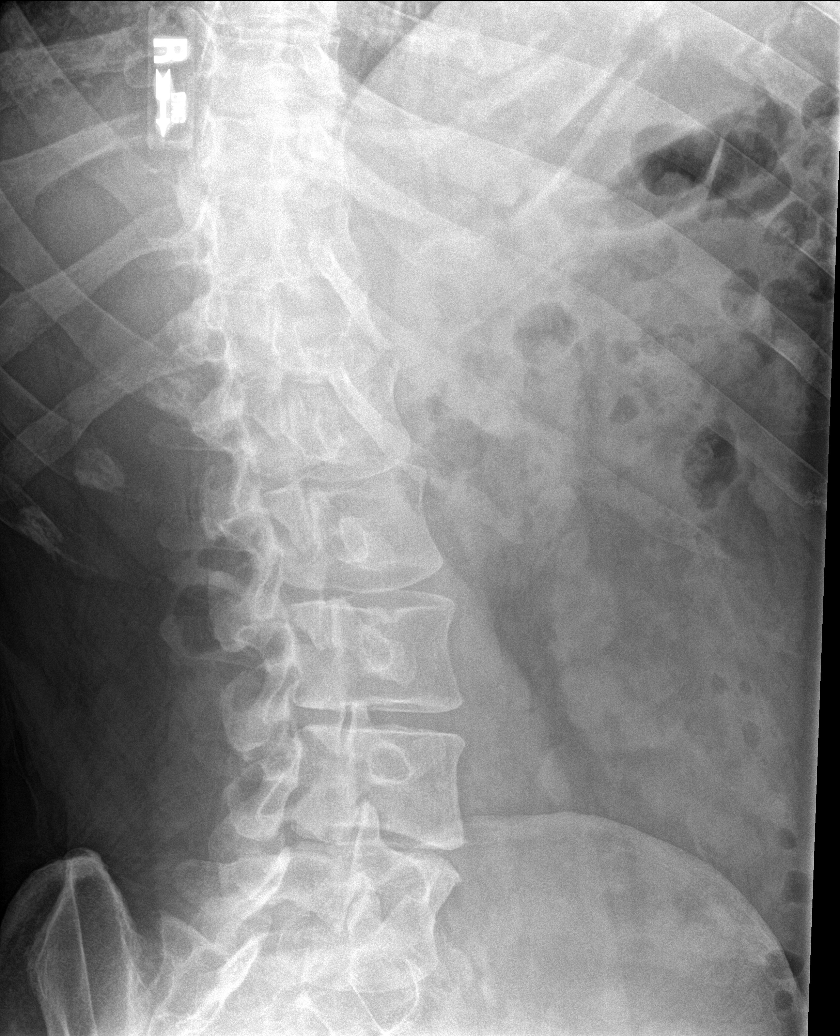

[l-spine lat]
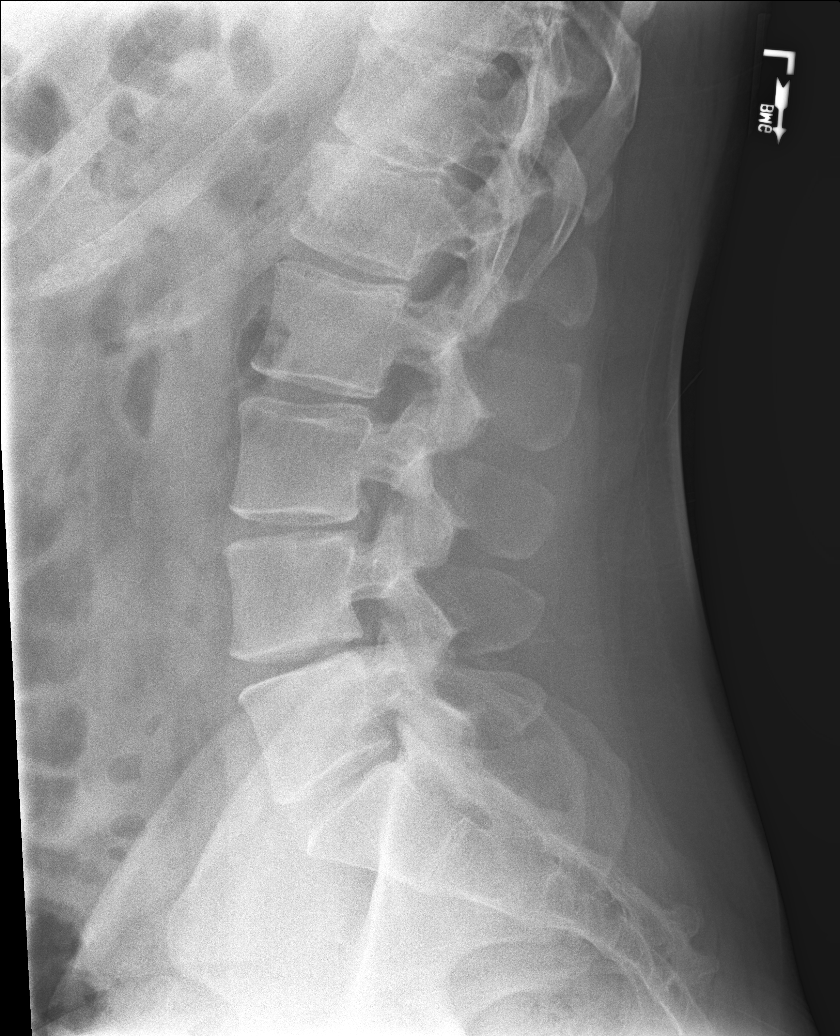

[l-spine l5-s1]
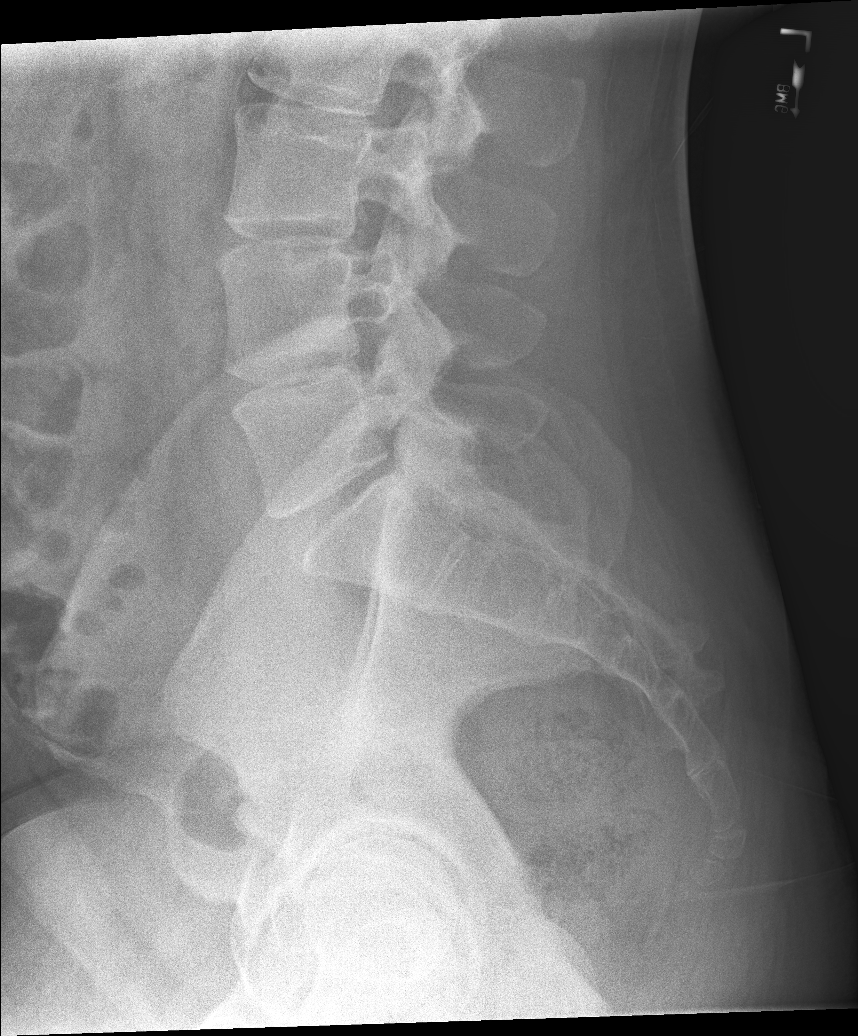

[5 of 5 positions shown; findings below may reference images not displayed]

FINDINGS: No acute fracture or subluxation.

Mild multilevel disc space narrowing noted.

No focal bony lesions or spondylolysis.

No other significant abnormalities.
IMPRESSION: Mild multilevel disc space narrowing without other significant
abnormality.
